# Patient Record
Sex: Female | Born: 1985 | Hispanic: Yes | Marital: Single | Smoking: Never smoker
Health system: Southern US, Community
[De-identification: ages and names within clinical notes are randomized; demographics above are authoritative.]

## PROBLEM LIST (undated history)

## (undated) ENCOUNTER — Emergency Department (HOSPITAL_COMMUNITY): Admission: EM | Payer: Self-pay | Source: Home / Self Care

## (undated) DIAGNOSIS — F410 Panic disorder [episodic paroxysmal anxiety] without agoraphobia: Secondary | ICD-10-CM

## (undated) DIAGNOSIS — K5792 Diverticulitis of intestine, part unspecified, without perforation or abscess without bleeding: Secondary | ICD-10-CM

---

## 2004-08-02 ENCOUNTER — Emergency Department: Payer: Self-pay | Admitting: Emergency Medicine

## 2009-07-17 ENCOUNTER — Emergency Department (HOSPITAL_COMMUNITY): Admission: EM | Admit: 2009-07-17 | Discharge: 2009-07-17 | Payer: Self-pay | Admitting: Emergency Medicine

## 2009-07-19 ENCOUNTER — Ambulatory Visit (HOSPITAL_COMMUNITY): Admission: RE | Admit: 2009-07-19 | Discharge: 2009-07-19 | Payer: Self-pay | Admitting: Emergency Medicine

## 2010-09-25 LAB — COMPREHENSIVE METABOLIC PANEL
ALT: 37 U/L — ABNORMAL HIGH (ref 0–35)
Albumin: 3.2 g/dL — ABNORMAL LOW (ref 3.5–5.2)
Alkaline Phosphatase: 101 U/L (ref 39–117)
BUN: 9 mg/dL (ref 6–23)
CO2: 25 mEq/L (ref 19–32)
Calcium: 8.3 mg/dL — ABNORMAL LOW (ref 8.4–10.5)
Chloride: 103 mEq/L (ref 96–112)
Creatinine, Ser: 0.6 mg/dL (ref 0.4–1.2)
GFR calc Af Amer: >60 mL/min (ref >60)
Total Protein: 6.4 g/dL (ref 6.0–8.3)

## 2010-09-25 LAB — URINALYSIS, ROUTINE W REFLEX MICROSCOPIC
Glucose, UA: NEGATIVE mg/dL
Leukocytes, UA: NEGATIVE
Nitrite: NEGATIVE
pH: 7 (ref 5.0–8.0)

## 2010-09-25 LAB — CBC
Hemoglobin: 14 g/dL (ref 12.0–15.0)
MCHC: 34 g/dL (ref 30.0–36.0)
RBC: 5 MIL/uL (ref 3.87–5.11)

## 2010-09-25 LAB — DIFFERENTIAL
Basophils Absolute: 0.1 10*3/uL (ref 0.0–0.1)
Eosinophils Relative: 0 % (ref 0–5)
Lymphocytes Relative: 22 % (ref 12–46)
Monocytes Absolute: 0.8 10*3/uL (ref 0.1–1.0)
Monocytes Relative: 9 % (ref 3–12)

## 2010-09-25 LAB — PREGNANCY, URINE: Preg Test, Ur: NEGATIVE

## 2010-09-25 LAB — POCT CARDIAC MARKERS: Troponin i, poc: <0.05 ng/mL (ref 0.00–0.09)

## 2010-09-25 LAB — URINE MICROSCOPIC-ADD ON

## 2014-05-05 ENCOUNTER — Emergency Department: Payer: Self-pay | Admitting: Emergency Medicine

## 2014-05-05 LAB — COMPREHENSIVE METABOLIC PANEL
ALBUMIN: 3.4 g/dL (ref 3.4–5.0)
ALK PHOS: 149 U/L — AB
ANION GAP: 7 (ref 7–16)
AST: 18 U/L (ref 15–37)
BILIRUBIN TOTAL: 1.5 mg/dL — AB (ref 0.2–1.0)
BUN: 12 mg/dL (ref 7–18)
CO2: 26 mmol/L (ref 21–32)
Calcium, Total: 8.1 mg/dL — ABNORMAL LOW (ref 8.5–10.1)
Chloride: 107 mmol/L (ref 98–107)
Creatinine: 0.71 mg/dL (ref 0.60–1.30)
EGFR (African American): >60
EGFR (Non-African Amer.): >60
Glucose: 133 mg/dL — ABNORMAL HIGH (ref 65–99)
OSMOLALITY: 281 (ref 275–301)
POTASSIUM: 3.8 mmol/L (ref 3.5–5.1)
SGPT (ALT): 30 U/L
SODIUM: 140 mmol/L (ref 136–145)
Total Protein: 7.8 g/dL (ref 6.4–8.2)

## 2014-05-05 LAB — CBC WITH DIFFERENTIAL/PLATELET
BASOS PCT: 0.3 %
Basophil #: 0 10*3/uL (ref 0.0–0.1)
Eosinophil #: 0.1 10*3/uL (ref 0.0–0.7)
Eosinophil %: 0.8 %
HCT: 44.8 % (ref 35.0–47.0)
HGB: 14.6 g/dL (ref 12.0–16.0)
Lymphocyte #: 2.2 10*3/uL (ref 1.0–3.6)
Lymphocyte %: 20 %
MCH: 26.8 pg (ref 26.0–34.0)
MCHC: 32.6 g/dL (ref 32.0–36.0)
MCV: 82 fL (ref 80–100)
MONO ABS: 0.4 x10 3/mm (ref 0.2–0.9)
MONOS PCT: 4.1 %
Neutrophil #: 8.2 10*3/uL — ABNORMAL HIGH (ref 1.4–6.5)
Neutrophil %: 74.8 %
Platelet: 271 10*3/uL (ref 150–440)
RBC: 5.45 10*6/uL — AB (ref 3.80–5.20)
RDW: 13.8 % (ref 11.5–14.5)
WBC: 11 10*3/uL (ref 3.6–11.0)

## 2014-05-05 LAB — URINALYSIS, COMPLETE
BACTERIA: NONE SEEN
Bilirubin,UR: NEGATIVE
GLUCOSE, UR: NEGATIVE mg/dL (ref 0–75)
LEUKOCYTE ESTERASE: NEGATIVE
NITRITE: NEGATIVE
PROTEIN: NEGATIVE
Ph: 5 (ref 4.5–8.0)
RBC,UR: 6 /HPF (ref 0–5)
SPECIFIC GRAVITY: 1.026 (ref 1.003–1.030)
Squamous Epithelial: <1
WBC UR: <1 /HPF (ref 0–5)

## 2014-05-05 LAB — LIPASE, BLOOD: LIPASE: 54 U/L — AB (ref 73–393)

## 2015-05-22 ENCOUNTER — Emergency Department
Admission: EM | Admit: 2015-05-22 | Discharge: 2015-05-22 | Disposition: A | Discharge status: Home / Self Care | Payer: Self-pay | Attending: Emergency Medicine | Admitting: Emergency Medicine

## 2015-05-22 ENCOUNTER — Emergency Department: Payer: Self-pay

## 2015-05-22 ENCOUNTER — Encounter: Payer: Self-pay | Admitting: Emergency Medicine

## 2015-05-22 DIAGNOSIS — Z880 Allergy status to penicillin: Secondary | ICD-10-CM | POA: Insufficient documentation

## 2015-05-22 DIAGNOSIS — K5732 Diverticulitis of large intestine without perforation or abscess without bleeding: Secondary | ICD-10-CM | POA: Insufficient documentation

## 2015-05-22 DIAGNOSIS — Z3202 Encounter for pregnancy test, result negative: Secondary | ICD-10-CM | POA: Insufficient documentation

## 2015-05-22 HISTORY — DX: Panic disorder (episodic paroxysmal anxiety): F41.0

## 2015-05-22 HISTORY — DX: Diverticulitis of intestine, part unspecified, without perforation or abscess without bleeding: K57.92

## 2015-05-22 LAB — CBC
HCT: 43.1 % (ref 35.0–47.0)
HEMOGLOBIN: 14.3 g/dL (ref 12.0–16.0)
MCH: 26.8 pg (ref 26.0–34.0)
MCHC: 33.1 g/dL (ref 32.0–36.0)
MCV: 80.9 fL (ref 80.0–100.0)
Platelets: 289 10*3/uL (ref 150–440)
RBC: 5.33 MIL/uL — ABNORMAL HIGH (ref 3.80–5.20)
RDW: 13.5 % (ref 11.5–14.5)
WBC: 14.5 10*3/uL — AB (ref 3.6–11.0)

## 2015-05-22 LAB — URINALYSIS COMPLETE WITH MICROSCOPIC (ARMC ONLY)
BACTERIA UA: NONE SEEN
Bilirubin Urine: NEGATIVE
Glucose, UA: NEGATIVE mg/dL
Ketones, ur: NEGATIVE mg/dL
LEUKOCYTES UA: NEGATIVE
NITRITE: NEGATIVE
PH: 6 (ref 5.0–8.0)
PROTEIN: NEGATIVE mg/dL
Specific Gravity, Urine: 1.012 (ref 1.005–1.030)

## 2015-05-22 LAB — COMPREHENSIVE METABOLIC PANEL
ALK PHOS: 132 U/L — AB (ref 38–126)
ALT: 20 U/L (ref 14–54)
ANION GAP: 6 (ref 5–15)
AST: 16 U/L (ref 15–41)
Albumin: 3.5 g/dL (ref 3.5–5.0)
BUN: 9 mg/dL (ref 6–20)
CHLORIDE: 106 mmol/L (ref 101–111)
CO2: 24 mmol/L (ref 22–32)
Calcium: 8.4 mg/dL — ABNORMAL LOW (ref 8.9–10.3)
Creatinine, Ser: 0.53 mg/dL (ref 0.44–1.00)
GLUCOSE: 109 mg/dL — AB (ref 65–99)
POTASSIUM: 3.8 mmol/L (ref 3.5–5.1)
Sodium: 136 mmol/L (ref 135–145)
Total Bilirubin: 2.1 mg/dL — ABNORMAL HIGH (ref 0.3–1.2)
Total Protein: 7.5 g/dL (ref 6.5–8.1)

## 2015-05-22 LAB — LIPASE, BLOOD: Lipase: 20 U/L (ref 11–51)

## 2015-05-22 LAB — PREGNANCY, URINE: Preg Test, Ur: NEGATIVE

## 2015-05-22 MED ORDER — OXYCODONE-ACETAMINOPHEN 5-325 MG PO TABS: 1.0000 | ORAL_TABLET | Freq: Four times a day (QID) | ORAL | Status: AC | PRN

## 2015-05-22 MED ORDER — MORPHINE SULFATE (PF) 4 MG/ML IV SOLN
4.0000 mg | Freq: Once | INTRAVENOUS | Status: AC
  Administered 2015-05-22: 4 mg via INTRAVENOUS
  Filled 2015-05-22: qty 1

## 2015-05-22 MED ORDER — ONDANSETRON HCL 4 MG/2ML IJ SOLN
4.0000 mg | Freq: Once | INTRAMUSCULAR | Status: AC
  Administered 2015-05-22: 4 mg via INTRAVENOUS
  Filled 2015-05-22: qty 2

## 2015-05-22 MED ORDER — METRONIDAZOLE 500 MG PO TABS
500.0000 mg | ORAL_TABLET | Freq: Once | ORAL | Status: AC
  Administered 2015-05-22: 500 mg via ORAL
  Filled 2015-05-22: qty 1

## 2015-05-22 MED ORDER — CIPROFLOXACIN HCL 500 MG PO TABS: 500.0000 mg | ORAL_TABLET | Freq: Two times a day (BID) | ORAL | Status: AC

## 2015-05-22 MED ORDER — IOHEXOL 240 MG/ML SOLN
25.0000 mL | Freq: Once | INTRAMUSCULAR | Status: AC | PRN
  Administered 2015-05-22: 25 mL via ORAL

## 2015-05-22 MED ORDER — IOHEXOL 300 MG/ML  SOLN
100.0000 mL | Freq: Once | INTRAMUSCULAR | Status: AC | PRN
  Administered 2015-05-22: 100 mL via INTRAVENOUS

## 2015-05-22 MED ORDER — METRONIDAZOLE 500 MG PO TABS: 500.0000 mg | ORAL_TABLET | Freq: Three times a day (TID) | ORAL | Status: AC

## 2015-05-22 MED ORDER — CIPROFLOXACIN HCL 500 MG PO TABS
500.0000 mg | ORAL_TABLET | Freq: Once | ORAL | Status: AC
  Administered 2015-05-22: 500 mg via ORAL
  Filled 2015-05-22: qty 1

## 2015-05-22 NOTE — ED Notes (Signed)
Has had in past and was told it was diverticulitis

## 2015-05-22 NOTE — ED Notes (Signed)
Protocols completed by triage - pt to room and given cup for urine sample

## 2015-05-22 NOTE — ED Notes (Signed)
Rainbow completed.  Still needs urine

## 2015-05-22 NOTE — Discharge Instructions (Signed)
Diverticulitis  Diverticulitis is when small pockets that have formed in your colon (large intestine) become infected or swollen.  HOME CARE  · Follow your doctor's instructions.  · Follow a special diet if told by your doctor.  · When you feel better, your doctor may tell you to change your diet. You may be told to eat a lot of fiber. Fruits and vegetables are good sources of fiber. Fiber makes it easier to poop (have bowel movements).  · Take supplements or probiotics as told by your doctor.  · Only take medicines as told by your doctor.  · Keep all follow-up visits with your doctor.  GET HELP IF:  · Your pain does not get better.  · You have a hard time eating food.  · You are not pooping like normal.  GET HELP RIGHT AWAY IF:  · Your pain gets worse.  · Your problems do not get better.  · Your problems suddenly get worse.  · You have a fever.  · You keep throwing up (vomiting).  · You have bloody or black, tarry poop (stool).  MAKE SURE YOU:   · Understand these instructions.  · Will watch your condition.  · Will get help right away if you are not doing well or get worse.     This information is not intended to replace advice given to you by your health care provider. Make sure you discuss any questions you have with your health care provider.     Document Released: 12/13/2007 Document Revised: 07/01/2013 Document Reviewed: 05/21/2013  Elsevier Interactive Patient Education ©2016 [employer]

## 2015-05-22 NOTE — ED Provider Notes (Signed)
[facility] Emergency Department Provider Note  Time seen: 12:27 PM  I have reviewed the triage vital signs and the nursing notes.   HISTORY  Chief Complaint Abdominal Pain    HPI Jeanette Adams is a 29 y.o. female with no past medical history who presents the emergency department left-sided abdominal pain 3 days. According to the patient for the past 3 days she has had progressive left-sided abdominal pain. She describes the pain as dull/aching located in the left side of her abdomen radiating to her left lower quadrant. Denies black or bloody stool, diarrhea, nausea/vomiting, dysuria, but does state increased urination. Denies vaginal bleeding or discharge. Patient describes the discomfort as moderate. States she had a similar pain one year ago and was diagnosed clinically with diverticulitis (no CT scan), patient states she improved after antibiotics. States fever last night to 100.0.     Past Medical History  Diagnosis Date  . Diverticulitis   . Panic attacks     There are no active problems to display for this patient.   History reviewed. No pertinent past surgical history.  No current outpatient prescriptions on file.  Allergies Penicillins  History reviewed. No pertinent family history.  Social History Social History  Substance Use Topics  . Smoking status: Never Smoker   . Smokeless tobacco: None  . Alcohol Use: No    Review of Systems Constitutional: Positive for fever yesterday. Cardiovascular: Negative for chest pain. Respiratory: Negative for shortness of breath. Gastrointestinal: Positive for left-sided abdominal pain. Genitourinary: Negative for dysuria. Positive urinary frequency. Musculoskeletal: Negative for back pain. Neurological: Negative for headache 10-point ROS otherwise negative.  ____________________________________________   PHYSICAL EXAM:  VITAL SIGNS: ED Triage Vitals  Enc Vitals Group     BP  05/22/15 1147 136/82 mmHg     Pulse Rate 05/22/15 1147 112     Resp 05/22/15 1147 18     Temp 05/22/15 1147 98.5 F (36.9 C)     Temp Source 05/22/15 1147 Oral     SpO2 05/22/15 1147 97 %     Weight 05/22/15 1147 215 lb (97.523 kg)     Height 05/22/15 1147 5' (1.524 m)     Head Cir --      Peak Flow --      Pain Score 05/22/15 1148 9     Pain Loc --      Pain Edu? --      Excl. in GC? --     Constitutional: Alert and oriented. Well appearing and in no distress. Eyes: Normal exam ENT   Head: Normocephalic and atraumatic.   Mouth/Throat: Mucous membranes are moist. Cardiovascular: Normal rate, regular rhythm. No murmurs, rubs, or gallops. Respiratory: Normal respiratory effort without tachypnea nor retractions. Breath sounds are clear  Gastrointestinal: Soft, moderate left-sided abdominal tenderness to palpation, moderate left lower quadrant tenderness to palpation. Minimal suprapubic tenderness to palpation. No rebound or guarding. No distention. Minimal left CVA tenderness to palpation. Musculoskeletal: Nontender with normal range of motion in all extremities.   Neurologic:  Normal speech and language. No gross focal neurologic deficits Skin:  Skin is warm, dry and intact.  Psychiatric: Mood and affect are normal. Speech and behavior are normal.   ____________________________________________     RADIOLOGY  CT shows diverticulitis.  ____________________________________________   INITIAL IMPRESSION / ASSESSMENT AND PLAN / ED COURSE  Pertinent labs & imaging results that were available during my care of the patient were reviewed by me and considered in  my medical decision making (see chart for details).  Patient with left lower quadrant abdominal tenderness palpation. We'll check labs, and closely monitor in the emergency department. Differential is quite wide at this time, however would include diverticulitis versus cystitis/pyelonephritis.  Labs show an elevated  white blood cell count of 14.5, as well as an elevated bilirubin of 2.1 with a slightly elevated alkaline phosphatase. Patient has no right-sided abdominal tenderness palpation. No right upper quadrant tenderness to palpation. Patient had a similarly elevated alkaline phosphatase and bilirubin 04/2014 with a normal right upper quadrant ultrasound at that time. Given the patient's elevated white blood cell count, we will likely proceed with a CT abdomen/pelvis to further evaluate.  CT consistent with diverticulitis. The patient appears very well overall. We will discharge on antibiotics and pain medication. The patient is follow up with her primary care physician. I discussed my normal diverticulitis return precautions.  ____________________________________________   FINAL CLINICAL IMPRESSION(S) / ED DIAGNOSES  Left-sided abdominal pain Diverticulitis  Minna Antis, MD 05/22/15 1514

## 2015-05-22 NOTE — ED Notes (Signed)
Patient transported to CT 

## 2015-12-02 IMAGING — CT CT ABD-PELV W/ CM
2 of 4 series · 16 of 46 positions shown, 18 images · IV contrast (isovue)
Comparison: None.

CLINICAL DATA: Lower abdominal pain starting 4 days ago

EXAM:
CT ABDOMEN AND PELVIS WITH CONTRAST
TECHNIQUE: Multidetector CT imaging of the abdomen and pelvis was performed
using the standard protocol following bolus administration of
intravenous contrast.
CONTRAST:  100 cc Isovue

[Series 2: routine abd pel with · axial · 0.72mm/px · z∈[-839,-429]mm · 13 of 90 slices shown, 15 images]
[im 4/90  soft-tissue]
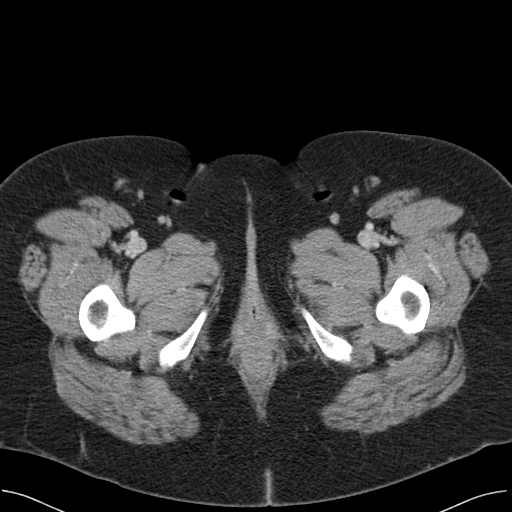
[im 4/90  bone]
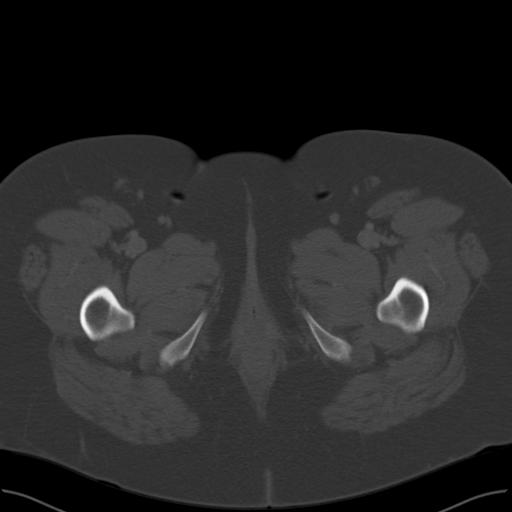
[im 12/90  soft-tissue]
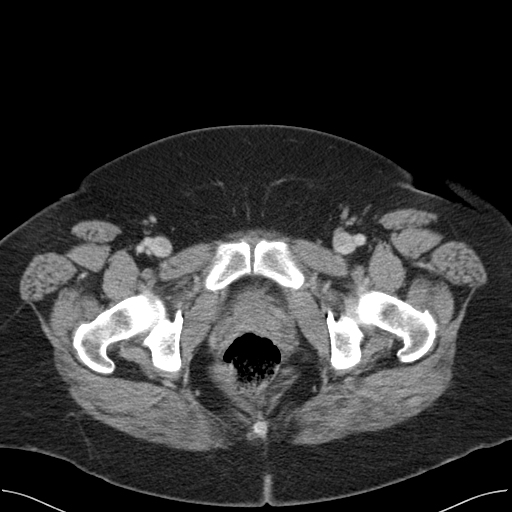
[im 19/90  soft-tissue]
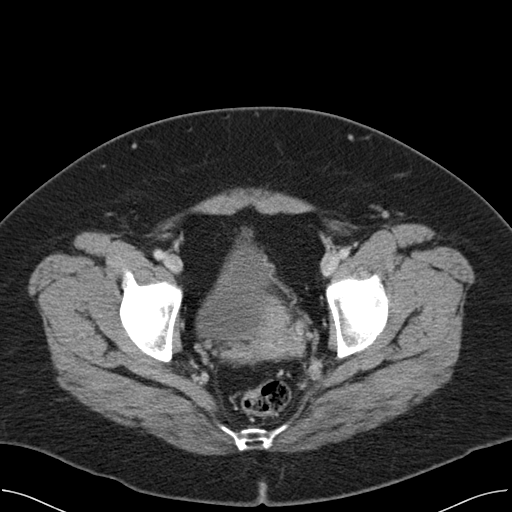
[im 26/90  soft-tissue]
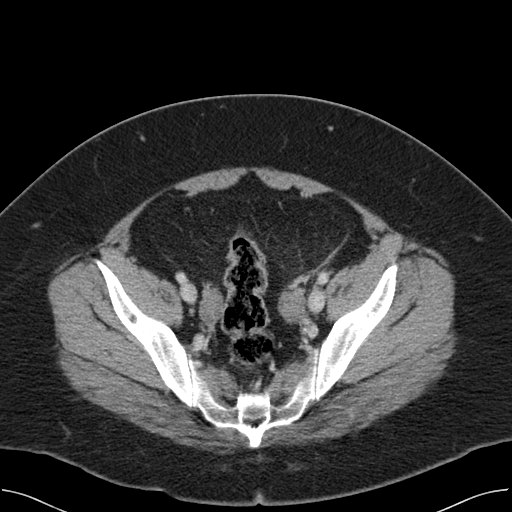
[im 30/90  soft-tissue]
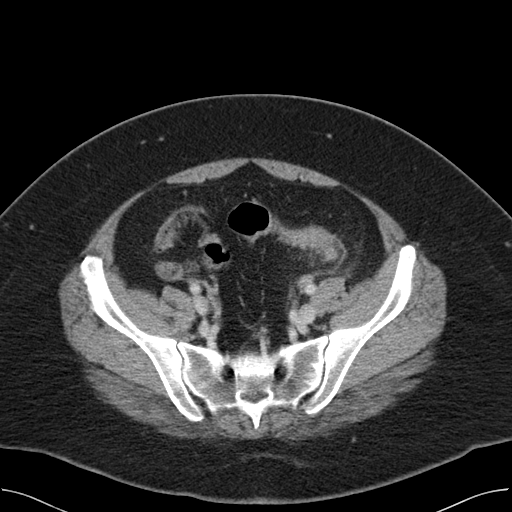
[im 38/90  soft-tissue]
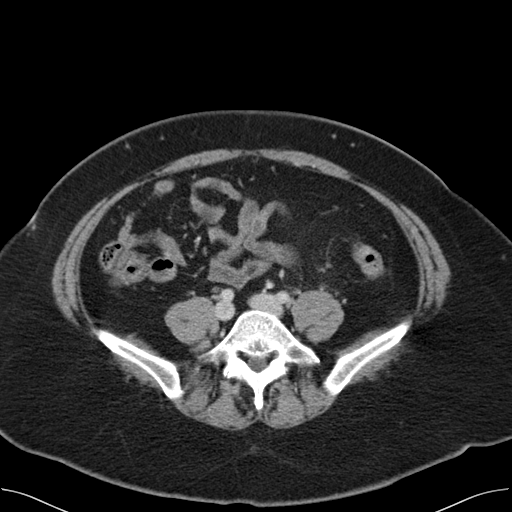
[im 45/90  soft-tissue]
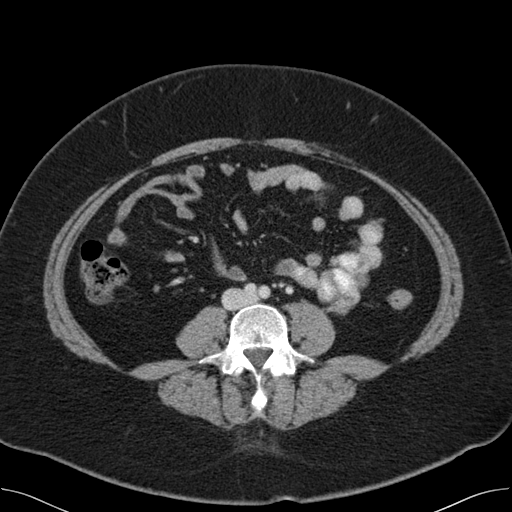
[im 52/90  soft-tissue]
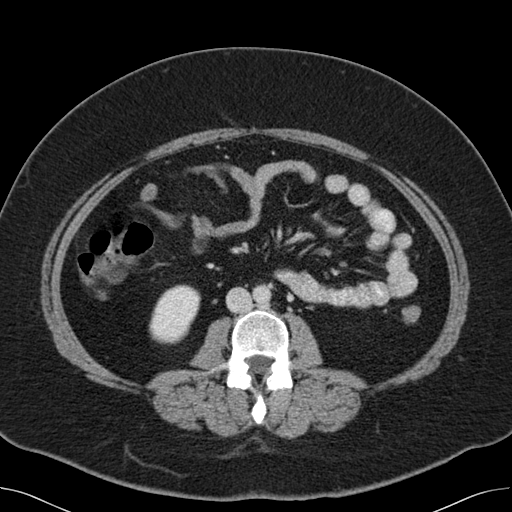
[im 60/90  soft-tissue]
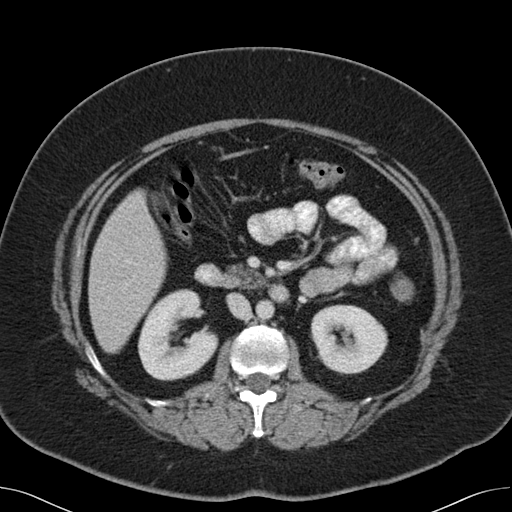
[im 60/90  bone]
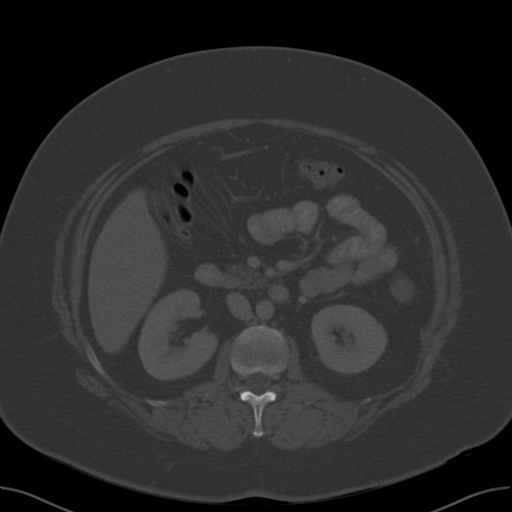
[im 64/90  soft-tissue]
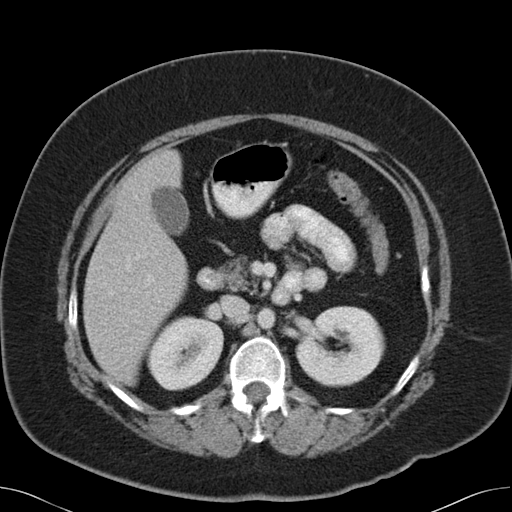
[im 71/90  soft-tissue]
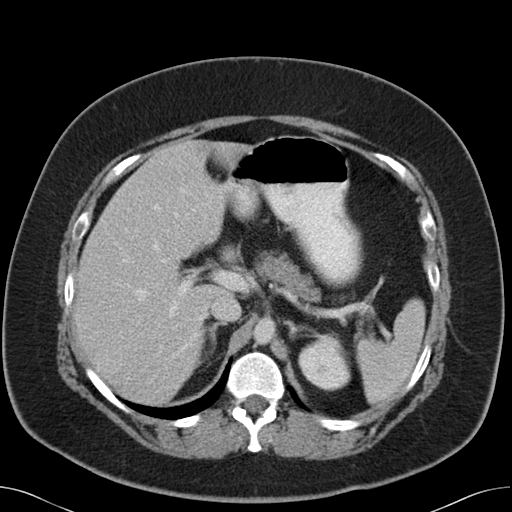
[im 78/90  soft-tissue]
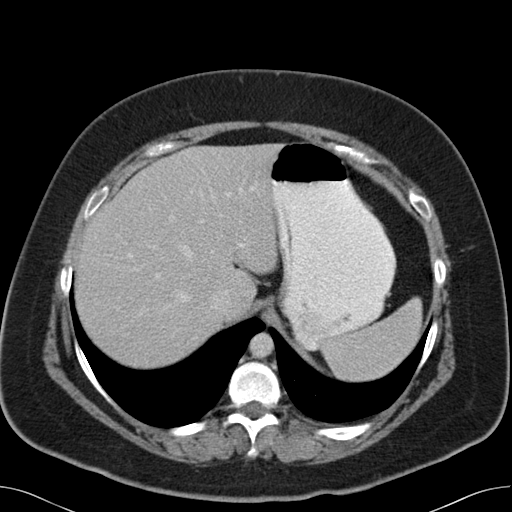
[im 86/90  soft-tissue]
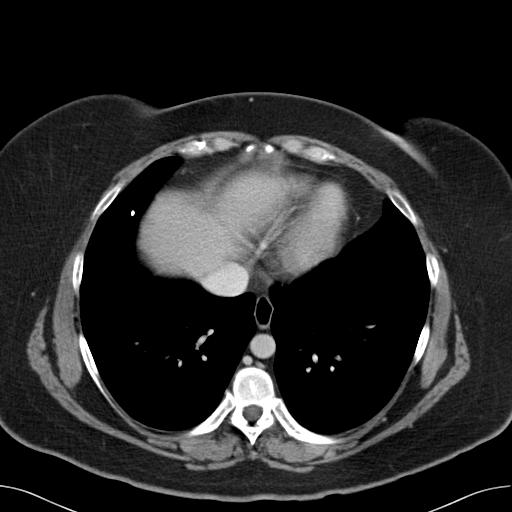

[Series 5: cor routine abd pel with · coronal · 0.80mm/px · 3 of 161 slices shown]
[im 54/161  soft-tissue]
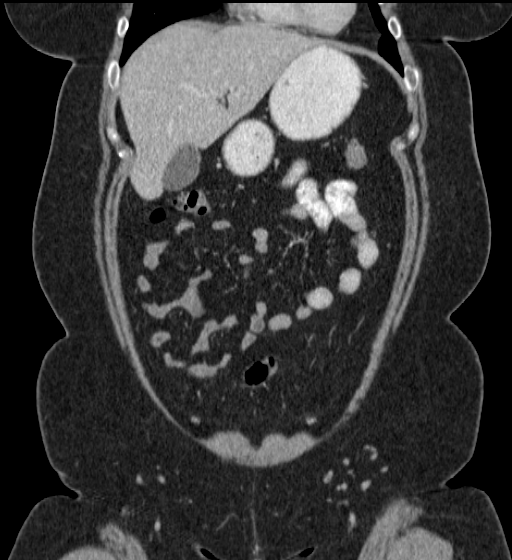
[im 72/161  soft-tissue]
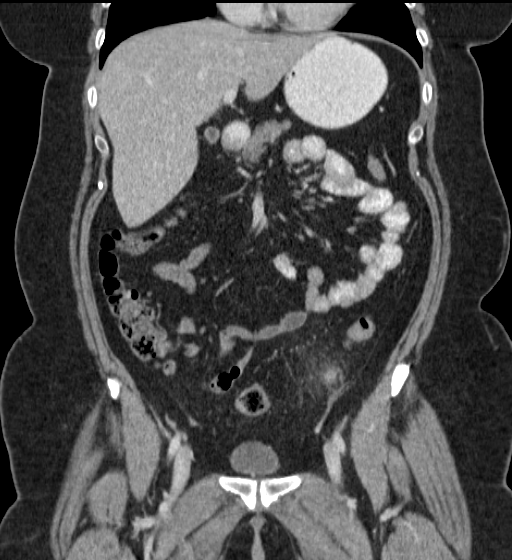
[im 89/161  soft-tissue]
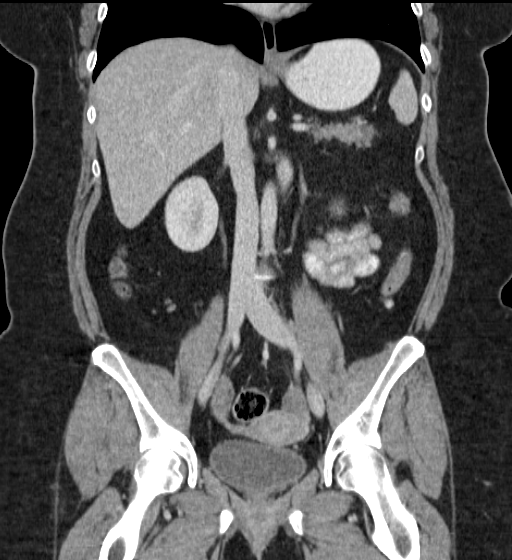

[16 of 46 positions shown; findings below may reference images not displayed]

FINDINGS: Lung bases are unremarkable. Sagittal images of the spine are
unremarkable.

Liver, pancreas, spleen and adrenal glands are unremarkable.
Abdominal aorta is unremarkable. Kidneys are symmetrical in size and
enhancement. No hydronephrosis or hydroureter.

No small bowel obstruction. No abdominal ascites. No free abdominal
air. Tiny umbilical hernia containing fat without evidence of acute
complication.

There is no pericecal inflammation. The terminal ileum is
unremarkable. Normal appendix partially visualized in coronal image
84.

Few diverticula are noted descending colon. In axial image 61 there
is abnormal mild focal thickening of colonic wall in proximal
sigmoid colon left lower quadrant. There is a inflamed colonic
diverticulum at this level with mild stranding of surrounding fat.
Best seen in axial image 61. This is confirmed in coronal image 71.
There is mild thickening and enhancement of diverticular wall.
Findings are consistent with acute diverticulitis. No definite
extrarenal contrast material or air. No mesenteric abscess is noted.
Some stool noted in rectosigmoid colon. The uterus and adnexa are
unremarkable.

The urinary bladder is unremarkable.
IMPRESSION: 1. Abnormal focal thickening of proximal sigmoid colon in left lower
quadrant. A inflamed colonic diverticulum is noted at this level
axial image 61 with mild stranding of surrounding fat. Findings are
consistent with acute diverticulitis. No definite evidence of
extraluminal contrast or air.
2. Normal appendix.  No pericecal inflammation.
3. No hydronephrosis or hydroureter.
4. No small bowel obstruction.

## 2019-06-02 ENCOUNTER — Other Ambulatory Visit: Payer: Self-pay

## 2019-06-02 DIAGNOSIS — Z20822 Contact with and (suspected) exposure to covid-19: Secondary | ICD-10-CM

## 2019-06-03 LAB — NOVEL CORONAVIRUS, NAA: SARS-CoV-2, NAA: DETECTED — AB
